# Patient Record
Sex: Female | Born: 1995
Health system: Southern US, Community
[De-identification: ages and names within clinical notes are randomized; demographics above are authoritative.]

## PROBLEM LIST (undated history)

## (undated) HISTORY — PX: WISDOM TOOTH EXTRACTION: SHX21

---
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
## 2020-06-19 ENCOUNTER — Ambulatory Visit
Admission: EM | Admit: 2020-06-19 | Discharge: 2020-06-19 | Disposition: A | Discharge status: Home / Self Care | Payer: BC Managed Care – PPO | Attending: Physician Assistant | Admitting: Physician Assistant

## 2020-06-19 ENCOUNTER — Encounter: Payer: Self-pay | Admitting: Emergency Medicine

## 2020-06-19 ENCOUNTER — Other Ambulatory Visit: Payer: Self-pay

## 2020-06-19 DIAGNOSIS — L03012 Cellulitis of left finger: Secondary | ICD-10-CM | POA: Diagnosis not present

## 2020-06-19 MED ORDER — CEPHALEXIN 500 MG PO CAPS: 500.0000 mg | ORAL_CAPSULE | Freq: Four times a day (QID) | ORAL | 0 refills | Status: DC

## 2020-06-19 NOTE — Discharge Instructions (Signed)
You can remove current dressing in 24 hours. Keep wound clean and dry. You can clean gently with soap and water. Continue warm soaks 10-15 mins at a time. If having continued redness/pain, start keflex as directed. Monitor for spreading redness, increased warmth, increased swelling, fever, follow up for reevaluation needed.

## 2020-06-19 NOTE — ED Triage Notes (Signed)
Pt here for left middle finger pain and swelling around cuticle x 3 days

## 2020-06-19 NOTE — ED Provider Notes (Signed)
[facility]    CSN: [id number] Arrival date & time: 06/19/20  1215      History   Chief Complaint Chief Complaint  Patient presents with  . Hand Pain    HPI Jill Cisneros is a 24 y.o. female.   24 year old female comes in for 3 day history of left middle finger pain/swelling after having a manicure. Has been doing warm compress and topical abx ointment without relief. Denies spreading erythema, fever.      History reviewed. No pertinent past medical history.  There are no problems to display for this patient.   History reviewed. No pertinent surgical history.  OB History   No obstetric history on file.      Home Medications    Prior to Admission medications   Medication Sig Start Date End Date Taking? Authorizing Provider  cephALEXin (KEFLEX) 500 MG capsule Take 1 capsule (500 mg total) by mouth 4 (four) times daily. 06/19/20   Belinda Fisher, PA-C    Family History Family History  Problem Relation Age of Onset  . Healthy Mother     Social History Social History   Tobacco Use  . Smoking status: Never Smoker  . Smokeless tobacco: Never Used  Substance Use Topics  . Alcohol use: Not Currently  . Drug use: Never     Allergies   Hydrocodone   Review of Systems Review of Systems  Reason unable to perform ROS: See HPI as above.     Physical Exam Triage Vital Signs ED Triage Vitals [06/19/20 1250]  Enc Vitals Group     BP 110/62     Pulse Rate 91     Resp 18     Temp 98.5 F (36.9 C)     Temp Source Oral     SpO2 98 %     Weight      Height      Head Circumference      Peak Flow      Pain Score 6     Pain Loc      Pain Edu?      Excl. in GC?    No data found.  Updated Vital Signs BP 110/62 (BP Location: Left Arm)   Pulse 91   Temp 98.5 F (36.9 C) (Oral)   Resp 18   SpO2 98%   Visual Acuity Right Eye Distance:   Left Eye Distance:   Bilateral Distance:    Right Eye Near:   Left Eye Near:    Bilateral  Near:     Physical Exam Constitutional:      General: She is not in acute distress.    Appearance: Normal appearance. She is well-developed. She is not toxic-appearing or diaphoretic.  HENT:     Head: Normocephalic and atraumatic.  Eyes:     Conjunctiva/sclera: Conjunctivae normal.     Pupils: Pupils are equal, round, and reactive to light.  Pulmonary:     Effort: Pulmonary effort is normal. No respiratory distress.  Musculoskeletal:     Cervical back: Normal range of motion and neck supple.     Comments: Paronychia to left middle finger with surrounding erythema. No warmth. Tender to palpation. Full ROM. NVI  Skin:    General: Skin is warm and dry.  Neurological:     Mental Status: She is alert and oriented to person, place, and time.      UC Treatments / Results  Labs (all labs ordered are listed, but  only abnormal results are displayed) Labs Reviewed - No data to display  EKG   Radiology No results found.  Procedures Incision and Drainage  Date/Time: 06/19/2020 1:44 PM Performed by: Belinda Fisher, PA-C Authorized by: Belinda Fisher, PA-C   Consent:    Consent obtained:  Verbal   Consent given by:  Patient   Risks discussed:  Bleeding, incomplete drainage, infection and pain   Alternatives discussed:  Alternative treatment Location:    Type:  Abscess   Location:  Upper extremity   Upper extremity location:  Finger   Finger location:  L long finger Pre-procedure details:    Skin preparation:  Chloraprep Anesthesia (see MAR for exact dosages):    Anesthesia method:  None Procedure type:    Complexity:  Simple Procedure details:    Needle aspiration: no     Incision types:  Stab incision   Incision depth:  Dermal   Scalpel blade:  11   Drainage amount:  Scant   Wound treatment:  Wound left open   Packing materials:  None Post-procedure details:    Patient tolerance of procedure:  Tolerated well, no immediate complications   (including critical care  time)  Medications Ordered in UC Medications - No data to display  Initial Impression / Assessment and Plan / UC Course  I have reviewed the triage vital signs and the nursing notes.  Pertinent labs & imaging results that were available during my care of the patient were reviewed by me and considered in my medical decision making (see chart for details).    Patient tolerated procedure well. Wound care instructions given. Will have patient continue warm compress. If symptoms not improving, can start Rx of keflex as directed. Return precautions given. Patient expresses understanding and agrees to plan.   Final Clinical Impressions(s) / UC Diagnoses   Final diagnoses:  Paronychia of left middle finger    ED Prescriptions    Medication Sig Dispense Auth. Provider   cephALEXin (KEFLEX) 500 MG capsule Take 1 capsule (500 mg total) by mouth 4 (four) times daily. 28 capsule Belinda Fisher, PA-C     PDMP not reviewed this encounter.   Belinda Fisher, PA-C 06/19/20 1345

## 2020-07-28 ENCOUNTER — Encounter: Payer: Self-pay | Admitting: Family Medicine

## 2020-07-28 ENCOUNTER — Other Ambulatory Visit: Payer: Self-pay

## 2020-07-28 ENCOUNTER — Ambulatory Visit (INDEPENDENT_AMBULATORY_CARE_PROVIDER_SITE_OTHER): Payer: BC Managed Care – PPO | Admitting: Family Medicine

## 2020-07-28 VITALS — BP 100/70 | HR 79 | Temp 97.8°F | Ht 60.5 in | Wt 118.2 lb

## 2020-07-28 DIAGNOSIS — Z3009 Encounter for other general counseling and advice on contraception: Secondary | ICD-10-CM

## 2020-07-28 MED ORDER — LEVONORGESTREL-ETHINYL ESTRAD 0.1-20 MG-MCG PO TABS: 1.0000 | ORAL_TABLET | Freq: Every day | ORAL | 3 refills | Status: DC

## 2020-07-28 NOTE — Patient Instructions (Signed)
Schedule annual for your pap smear

## 2020-07-28 NOTE — Progress Notes (Signed)
   Subjective:     Jill Cisneros is a 24 y.o. female presenting for Establish Care (needs different BC/ no PAP today but will make appt)     HPI   #Birth control - happy with current - one partner - not interested in STI testing  Review of Systems   Social History   Tobacco Use  Smoking Status Never Smoker  Smokeless Tobacco Never Used        Objective:    BP Readings from Last 3 Encounters:  07/28/20 100/70  06/19/20 110/62   Wt Readings from Last 3 Encounters:  07/28/20 118 lb 4 oz (53.6 kg)    BP 100/70   Pulse 79   Temp 97.8 F (36.6 C) (Temporal)   Ht 5' 0.5" (1.537 m)   Wt 118 lb 4 oz (53.6 kg)   LMP 07/27/2020 (Exact Date)   SpO2 100%   BMI 22.71 kg/m    Physical Exam Constitutional:      General: She is not in acute distress.    Appearance: She is well-developed. She is not diaphoretic.  HENT:     Right Ear: External ear normal.     Left Ear: External ear normal.  Eyes:     Conjunctiva/sclera: Conjunctivae normal.  Cardiovascular:     Rate and Rhythm: Normal rate.  Pulmonary:     Effort: Pulmonary effort is normal.  Musculoskeletal:     Cervical back: Neck supple.  Skin:    General: Skin is warm and dry.     Capillary Refill: Capillary refill takes less than 2 seconds.  Neurological:     Mental Status: She is alert. Mental status is at baseline.  Psychiatric:        Mood and Affect: Mood normal.        Behavior: Behavior normal.           Assessment & Plan:   Problem List Items Addressed This Visit    None    Visit Diagnoses    Birth control counseling    -  Primary   Relevant Medications   levonorgestrel-ethinyl estradiol (ALESSE) 0.1-20 MG-MCG tablet     Switch to different brand as her current brand is going to stop production.   Return in 2 months for annual and check how she is doing on new birth control   Return in 2 months (on 09/28/2020) for annual exam.  Lynnda Child, MD  This visit occurred during  the SARS-CoV-2 public health emergency.  Safety protocols were in place, including screening questions prior to the visit, additional usage of staff PPE, and extensive cleaning of exam room while observing appropriate contact time as indicated for disinfecting solutions.

## 2020-09-22 ENCOUNTER — Telehealth (INDEPENDENT_AMBULATORY_CARE_PROVIDER_SITE_OTHER): Payer: BC Managed Care – PPO | Admitting: Family Medicine

## 2020-09-22 ENCOUNTER — Encounter: Payer: Self-pay | Admitting: Family Medicine

## 2020-09-22 ENCOUNTER — Other Ambulatory Visit: Payer: Self-pay

## 2020-09-22 DIAGNOSIS — K529 Noninfective gastroenteritis and colitis, unspecified: Secondary | ICD-10-CM | POA: Diagnosis not present

## 2020-09-22 MED ORDER — DICYCLOMINE HCL 10 MG PO CAPS: ORAL_CAPSULE | ORAL | 0 refills | Status: AC

## 2020-09-22 MED ORDER — ONDANSETRON 4 MG PO TBDP: 4.0000 mg | ORAL_TABLET | Freq: Three times a day (TID) | ORAL | 0 refills | Status: AC | PRN

## 2020-09-22 NOTE — Progress Notes (Signed)
Chief Complaint  Patient presents with  . Nausea  . Diarrhea    Food poisoning Needs a note for work     Subjective Jill Cisneros is a 25 y.o. female who presents with vomiting and diarrhea. Due to COVID-19 pandemic, we are interacting via web portal for an electronic face-to-face visit. I verified patient's ID using 2 identifiers. Patient agreed to proceed with visit via this method. Patient is at home, I am at office. Patient and I are present for visit.   Symptoms began this AM.  Ate [employer] and reg food last night.  Patient has diarrhea, abd pain and nausea Patient denies vomiting, fever and URI symptoms Evaluation to date: no Sick contacts: none known  History reviewed. No pertinent past medical history. Past Surgical History:  Procedure Laterality Date  . WISDOM TOOTH EXTRACTION     Current Outpatient Medications on File Prior to Visit  Medication Sig Dispense Refill  . Ascorbic Acid (VITAMIN C) 500 MG CAPS Take 2 capsules by mouth daily.    Marland Kitchen BLACK ELDERBERRY PO Take 2,300 mg by mouth daily.    . Cholecalciferol (VITAMIN D3) 50 MCG (2000 UT) TABS Take 1 tablet by mouth daily.    Marland Kitchen levonorgestrel-ethinyl estradiol (ALESSE) 0.1-20 MG-MCG tablet Take 1 tablet by mouth daily. 84 tablet 3  . Multiple Vitamin (MULTIVITAMIN ADULT PO) Take 1 tablet by mouth daily.      Exam No conversational dyspnea Age appropriate judgment and insight Nml affect and mood  Assessment and Plan  Gastroenteritis - Plan: ondansetron (ZOFRAN-ODT) 4 MG disintegrating tablet, dicyclomine (BENTYL) 10 MG capsule  Timing doesn't make sense for food poisoning, though not managed much different than above. Letter for work exusing thru Fri or sooner if she is feeling better.  F/u prn. The patient voiced understanding and agreement to the plan.  Jilda Roche [place], DO 09/22/20  2:26 PM

## 2021-03-07 DIAGNOSIS — J029 Acute pharyngitis, unspecified: Secondary | ICD-10-CM | POA: Diagnosis not present

## 2021-03-07 DIAGNOSIS — J069 Acute upper respiratory infection, unspecified: Secondary | ICD-10-CM | POA: Diagnosis not present

## 2021-07-29 ENCOUNTER — Encounter: Payer: BC Managed Care – PPO | Admitting: Family Medicine

## 2021-09-17 ENCOUNTER — Other Ambulatory Visit: Payer: Self-pay | Admitting: Family Medicine

## 2021-09-17 DIAGNOSIS — Z3009 Encounter for other general counseling and advice on contraception: Secondary | ICD-10-CM

## 2021-09-28 ENCOUNTER — Encounter: Payer: BC Managed Care – PPO | Admitting: Family Medicine

## 2021-10-15 ENCOUNTER — Encounter: Payer: Self-pay | Admitting: Family Medicine

## 2021-10-21 ENCOUNTER — Other Ambulatory Visit: Payer: Self-pay | Admitting: Family Medicine

## 2021-10-21 DIAGNOSIS — Z3009 Encounter for other general counseling and advice on contraception: Secondary | ICD-10-CM

## 2021-10-28 ENCOUNTER — Ambulatory Visit (INDEPENDENT_AMBULATORY_CARE_PROVIDER_SITE_OTHER): Payer: 59 | Admitting: Family Medicine

## 2021-10-28 ENCOUNTER — Other Ambulatory Visit: Payer: Self-pay

## 2021-10-28 ENCOUNTER — Other Ambulatory Visit (HOSPITAL_COMMUNITY)
Admission: RE | Admit: 2021-10-28 | Discharge: 2021-10-28 | Disposition: A | Discharge status: Home / Self Care | Payer: 59 | Source: Ambulatory Visit | Attending: Family Medicine | Admitting: Family Medicine

## 2021-10-28 VITALS — BP 104/70 | HR 91 | Temp 98.3°F | Resp 16 | Ht 60.5 in | Wt 133.0 lb

## 2021-10-28 DIAGNOSIS — Z13220 Encounter for screening for lipoid disorders: Secondary | ICD-10-CM

## 2021-10-28 DIAGNOSIS — Z1159 Encounter for screening for other viral diseases: Secondary | ICD-10-CM | POA: Diagnosis not present

## 2021-10-28 DIAGNOSIS — Z0000 Encounter for general adult medical examination without abnormal findings: Secondary | ICD-10-CM | POA: Insufficient documentation

## 2021-10-28 DIAGNOSIS — R6889 Other general symptoms and signs: Secondary | ICD-10-CM | POA: Diagnosis not present

## 2021-10-28 DIAGNOSIS — Z114 Encounter for screening for human immunodeficiency virus [HIV]: Secondary | ICD-10-CM

## 2021-10-28 DIAGNOSIS — Z3009 Encounter for other general counseling and advice on contraception: Secondary | ICD-10-CM

## 2021-10-28 LAB — LIPID PANEL
Cholesterol: 206 mg/dL — ABNORMAL HIGH (ref 0–200)
HDL: 66.2 mg/dL (ref >39.00)
LDL Cholesterol: 128 mg/dL — ABNORMAL HIGH (ref 0–99)
NonHDL: 140
Total CHOL/HDL Ratio: 3
Triglycerides: 62 mg/dL (ref 0.0–149.0)
VLDL: 12.4 mg/dL (ref 0.0–40.0)

## 2021-10-28 LAB — COMPREHENSIVE METABOLIC PANEL
ALT: 13 U/L (ref 0–35)
AST: 16 U/L (ref 0–37)
Albumin: 4.7 g/dL (ref 3.5–5.2)
Alkaline Phosphatase: 57 U/L (ref 39–117)
BUN: 13 mg/dL (ref 6–23)
CO2: 26 mEq/L (ref 19–32)
Calcium: 9.8 mg/dL (ref 8.4–10.5)
Chloride: 104 mEq/L (ref 96–112)
Creatinine, Ser: 0.72 mg/dL (ref 0.40–1.20)
GFR: 115.41 mL/min (ref >60.00)
Glucose, Bld: 87 mg/dL (ref 70–99)
Potassium: 4.9 mEq/L (ref 3.5–5.1)
Sodium: 137 mEq/L (ref 135–145)
Total Bilirubin: 0.6 mg/dL (ref 0.2–1.2)
Total Protein: 7.1 g/dL (ref 6.0–8.3)

## 2021-10-28 LAB — TSH: TSH: 2.76 u[IU]/mL (ref 0.35–5.50)

## 2021-10-28 LAB — CBC
HCT: 40.3 % (ref 36.0–46.0)
Hemoglobin: 13.3 g/dL (ref 12.0–15.0)
MCHC: 33.1 g/dL (ref 30.0–36.0)
MCV: 86.3 fl (ref 78.0–100.0)
Platelets: 222 10*3/uL (ref 150.0–400.0)
RBC: 4.66 Mil/uL (ref 3.87–5.11)
RDW: 12 % (ref 11.5–15.5)
WBC: 7 10*3/uL (ref 4.0–10.5)

## 2021-10-28 MED ORDER — LEVONORGESTREL-ETHINYL ESTRAD 0.1-20 MG-MCG PO TABS: 1.0000 | ORAL_TABLET | Freq: Every day | ORAL | 3 refills | Status: DC

## 2021-10-28 NOTE — Patient Instructions (Signed)
Labs today ? ?Work on [employer] ?

## 2021-10-28 NOTE — Progress Notes (Signed)
Annual Exam   Chief Complaint:  Chief Complaint  Patient presents with   Follow-up    Discuss about taking iron, she heard that it correlates with being cold all the time. Denies any pain today.     History of Present Illness:  Jill Cisneros is a 26 y.o. No obstetric history on file. who LMP was No LMP recorded., presents today for her annual examination.    #Feeling cold - wondering if this is an indication of low iron - no hair/skin/nails change - some weight gain - cycles - 4-5 days, 3-4 pads on the heavy day  Nutrition/Lifestyle Diet: all over the place - not healthy Exercise: not currently, tries to walk on her days off She does get adequate calcium and Vitamin D in her diet.  Social History   Tobacco Use  Smoking Status Never  Smokeless Tobacco Never   Social History   Substance and Sexual Activity  Alcohol Use Yes   Comment: occassionally    Social History   Substance and Sexual Activity  Drug Use Never     Safety The patient wears seatbelts: yes.     The patient feels safe at home and in their relationships: yes.  General Health Dentist in the last year: No Eye doctor: yes  Menstrual Her menses are normal no concerns  GYN She is single partner, contraception - OCP (estrogen/progesterone).     Cervical Cancer Screening (Age 64-65) Last Pap: never  Family History of Breast Cancer: no Family History of Ovarian Cancer: no    Weight Wt Readings from Last 3 Encounters:  10/28/21 133 lb (60.3 kg)  07/28/20 118 lb 4 oz (53.6 kg)   Patient has normal BMI  BMI Readings from Last 1 Encounters:  10/28/21 25.55 kg/m     Chronic disease screening Blood pressure monitoring:  BP Readings from Last 3 Encounters:  10/28/21 104/70  07/28/20 100/70  06/19/20 110/62     Lipid Monitoring: Indication for screening: age >20, obesity, diabetes, family hx, CV risk factors.  Lipid screening: Yes  No results found for: CHOL, HDL, LDLCALC,  LDLDIRECT, TRIG, CHOLHDL   Diabetes Screening: age >29, overweight, family hx, PCOS, hx of gestational diabetes, at risk ethnicity, elevated blood pressure >135/80.  Diabetes Screening screening: Yes, Not Indicated  No results found for: HGBA1C    No past medical history on file.  Past Surgical History:  Procedure Laterality Date   WISDOM TOOTH EXTRACTION      Prior to Admission medications   Medication Sig Start Date End Date Taking? Authorizing Provider  Ascorbic Acid (VITAMIN C) 500 MG CAPS Take 2 capsules by mouth daily.   Yes [provider]  Janann Colonel 0.1-20 MG-MCG tablet Take 1 tablet by mouth once daily 10/22/21  Yes Lynnda Child, MD  BLACK ELDERBERRY PO Take 2,300 mg by mouth daily.   Yes [provider]  Cholecalciferol (VITAMIN D3) 50 MCG (2000 UT) TABS Take 1 tablet by mouth daily.   Yes [provider]  dicyclomine (BENTYL) 10 MG capsule Take 1 tab every 6 hours as needed for abdominal cramping. 09/22/20  Yes Sharlene Dory, DO  Multiple Vitamin (MULTIVITAMIN ADULT PO) Take 1 tablet by mouth daily.   Yes [provider]  ondansetron (ZOFRAN-ODT) 4 MG disintegrating tablet Take 1 tablet (4 mg total) by mouth every 8 (eight) hours as needed for nausea or vomiting. Patient not taking: Reported on 10/28/2021 09/22/20   Sharlene Dory, DO  Allergies  Allergen Reactions   Hydrocodone     Gynecologic History: No LMP recorded.  Obstetric History: No obstetric history on file.  Social History   Socioeconomic History   Marital status: Single    Spouse name: Not on file   Number of children: Not on file   Years of education: some college   Highest education level: Not on file  Occupational History   Not on file  Tobacco Use   Smoking status: Never   Smokeless tobacco: Never  Vaping Use   Vaping Use: Never used  Substance and Sexual Activity   Alcohol use: Yes    Comment: occassionally    Drug use: Never    Sexual activity: Yes    Birth control/protection: Pill  Other Topics Concern   Not on file  Social History Narrative   07/28/20   From: the area   Living: with parents   Work: [employer]      Family: twin sister and older sister, good relationship with parents      Enjoys: go fishing      Exercise: walking around at work, nothing outside of that   Diet: limits eating out, eating at home      Safety   Seat belts: Yes    Guns: Yes  and secure   Safe in relationships: Yes    Social Determinants of Health   Financial Resource Strain: Not on file  Food Insecurity: Not on file  Transportation Needs: Not on file  Physical Activity: Not on file  Stress: Not on file  Social Connections: Not on file  Intimate Partner Violence: Not on file    Family History  Problem Relation Age of Onset   Healthy Mother     Review of Systems  Constitutional:  Negative for chills and fever.  HENT:  Negative for congestion and sore throat.   Eyes:  Negative for blurred vision and double vision.  Respiratory:  Negative for shortness of breath.   Cardiovascular:  Negative for chest pain.  Gastrointestinal:  Negative for heartburn, nausea and vomiting.  Genitourinary: Negative.   Musculoskeletal: Negative.  Negative for myalgias.  Skin:  Negative for rash.  Neurological:  Negative for dizziness and headaches.  Endo/Heme/Allergies:  Does not bruise/bleed easily.       Cold intolerance  Psychiatric/Behavioral:  Negative for depression. The patient is not nervous/anxious.     Physical Exam BP 104/70 (BP Location: Left Arm, Patient Position: Sitting, Cuff Size: Normal)   Pulse 91   Temp 98.3 F (36.8 C) (Oral)   Resp 16   Ht 5' 0.5" (1.537 m)   Wt 133 lb (60.3 kg)   SpO2 100%   BMI 25.55 kg/m    BP Readings from Last 3 Encounters:  10/28/21 104/70  07/28/20 100/70  06/19/20 110/62    Wt Readings from Last 3 Encounters:  10/28/21 133 lb (60.3 kg)  07/28/20 118 lb 4 oz (53.6  kg)     Physical Exam Exam conducted with a chaperone present.  Constitutional:      General: She is not in acute distress.    Appearance: She is well-developed. She is not diaphoretic.  HENT:     Head: Normocephalic and atraumatic.     Right Ear: External ear normal.     Left Ear: External ear normal.     Nose: Nose normal.  Eyes:     General: No scleral icterus.    Extraocular Movements: Extraocular movements intact.  Conjunctiva/sclera: Conjunctivae normal.  Cardiovascular:     Rate and Rhythm: Normal rate and regular rhythm.     Heart sounds: No murmur heard. Pulmonary:     Effort: Pulmonary effort is normal. No respiratory distress.     Breath sounds: Normal breath sounds. No wheezing.  Abdominal:     General: Bowel sounds are normal. There is no distension.     Palpations: Abdomen is soft. There is no mass.     Tenderness: There is no abdominal tenderness. There is no guarding or rebound.  Genitourinary:    Vagina: Normal.     Cervix: Discharge and erythema present. No cervical bleeding.  Musculoskeletal:        General: Normal range of motion.     Cervical back: Neck supple.  Lymphadenopathy:     Cervical: No cervical adenopathy.  Skin:    General: Skin is warm and dry.     Capillary Refill: Capillary refill takes less than 2 seconds.  Neurological:     Mental Status: She is alert and oriented to person, place, and time.     Deep Tendon Reflexes: Reflexes normal.  Psychiatric:        Mood and Affect: Mood normal.        Behavior: Behavior normal.      Results: Depression screen [facility] 2/9 07/28/2020  Decreased Interest 0  Down, Depressed, Hopeless 0  PHQ - 2 Score 0      Assessment: 26 y.o. No obstetric history on file. female here for routine annual examination.  Plan: Problem List Items Addressed This Visit   None Visit Diagnoses     Screening for HIV (human immunodeficiency virus)    -  Primary   Relevant Orders   HIV Antibody (routine  testing w rflx)   Need for hepatitis C screening test       Relevant Orders   Hepatitis C antibody   Annual physical exam       Relevant Orders   Comprehensive metabolic panel   Lipid panel   Cytology - PAP([place])   Cold intolerance       Relevant Orders   TSH   CBC   Screening for hyperlipidemia       Relevant Orders   Lipid panel   Birth control counseling       Relevant Medications   levonorgestrel-ethinyl estradiol (AVIANE) 0.1-20 MG-MCG tablet        Screening: -- Blood pressure screen normal -- cholesterol screening: will obtain -- Weight screening: normal -- Diabetes Screening: will obtain -- Nutrition: encouraged healthy diet   Psych -- Depression screening (PHQ-9):  Depression screen [facility] 2/9 07/28/2020  Decreased Interest 0  Down, Depressed, Hopeless 0  PHQ - 2 Score 0      Safety -- tobacco screening: not using -- alcohol screening:  low-risk usage. -- no evidence of domestic violence or intimate partner violence. -- STD screening: gonorrhea/chlamydia NAAT (Recommended for <24 years) not collected per patient request.  Cancer Screening -- pap smear collected per ASCCP guidelines -- family history of breast cancer screening: done. not at high risk.   Immunizations Immunization History  Administered Date(s) Administered   DTaP 08/14/1995, 10/09/1995, 12/05/1995, 12/26/1996, 03/14/2001   Hepatitis A 07/22/2008   Hepatitis B Oct 17, 1994, 07/13/1995, 03/19/1996   HiB (PRP-OMP) 08/14/1995, 10/09/1995, 12/05/1995, 12/26/1996   Hpv-Unspecified 07/22/2008, 10/09/2008, 02/17/2009   IPV 08/14/1995, 10/09/1995, 03/14/2001   MMR 06/13/1996, 03/14/2001   Meningococcal Conjugate 07/22/2008   Moderna Sars-Covid-2 Vaccination 01/28/2020, 02/24/2020  OPV 06/13/1996   Tdap 04/22/2008    -- flu vaccine not up to date - declined -- TDAP q10 years unknown, record requested-- HPV vaccination series (Age <26, shared decision 27-45): received -- Covid-19  Vaccine up to date  Encouraged regular vision and dental screening. Encouraged healthy exercise and diet.   Lynnda Child

## 2021-10-29 ENCOUNTER — Encounter: Payer: Self-pay | Admitting: Family Medicine

## 2021-10-29 LAB — CYTOLOGY - PAP
Comment: NEGATIVE
Diagnosis: NEGATIVE
High risk HPV: NEGATIVE

## 2021-10-29 LAB — HEPATITIS C ANTIBODY
Hepatitis C Ab: NONREACTIVE
SIGNAL TO CUT-OFF: <0.02 (ref <1.00)

## 2021-10-29 LAB — HIV ANTIBODY (ROUTINE TESTING W REFLEX): HIV 1&2 Ab, 4th Generation: NONREACTIVE

## 2021-11-03 ENCOUNTER — Encounter: Payer: Self-pay | Admitting: Family Medicine

## 2022-01-26 ENCOUNTER — Encounter: Payer: Self-pay | Admitting: Family Medicine

## 2022-01-26 DIAGNOSIS — T753XXS Motion sickness, sequela: Secondary | ICD-10-CM

## 2022-01-27 MED ORDER — SCOPOLAMINE 1 MG/3DAYS TD PT72: 1.0000 | MEDICATED_PATCH | TRANSDERMAL | 0 refills | Status: AC

## 2022-08-02 ENCOUNTER — Ambulatory Visit
Admission: RE | Admit: 2022-08-02 | Discharge: 2022-08-02 | Disposition: A | Discharge status: Home / Self Care | Payer: 59 | Source: Ambulatory Visit | Attending: Physician Assistant | Admitting: Physician Assistant

## 2022-08-02 VITALS — BP 112/65 | HR 83 | Temp 98.3°F | Resp 16

## 2022-08-02 DIAGNOSIS — N390 Urinary tract infection, site not specified: Secondary | ICD-10-CM | POA: Insufficient documentation

## 2022-08-02 DIAGNOSIS — R1032 Left lower quadrant pain: Secondary | ICD-10-CM | POA: Diagnosis not present

## 2022-08-02 DIAGNOSIS — R319 Hematuria, unspecified: Secondary | ICD-10-CM | POA: Diagnosis not present

## 2022-08-02 LAB — POCT URINALYSIS DIP (MANUAL ENTRY)
Bilirubin, UA: NEGATIVE
Glucose, UA: NEGATIVE mg/dL
Ketones, POC UA: NEGATIVE mg/dL
Nitrite, UA: POSITIVE — AB
Protein Ur, POC: 100 mg/dL — AB
Spec Grav, UA: >=1.03 — AB (ref 1.010–1.025)
Urobilinogen, UA: 1 E.U./dL
pH, UA: 6 (ref 5.0–8.0)

## 2022-08-02 LAB — POCT URINE PREGNANCY: Preg Test, Ur: NEGATIVE

## 2022-08-02 MED ORDER — NITROFURANTOIN MONOHYD MACRO 100 MG PO CAPS: 100.0000 mg | ORAL_CAPSULE | Freq: Two times a day (BID) | ORAL | 0 refills | Status: AC

## 2022-08-02 MED ORDER — IBUPROFEN 600 MG PO TABS: 600.0000 mg | ORAL_TABLET | Freq: Four times a day (QID) | ORAL | 0 refills | Status: AC | PRN

## 2022-08-02 NOTE — Discharge Instructions (Signed)
Advised to take the Macrobid every 12 hours to treat the urinary tract infection. Advised take ibuprofen 600 mg every 8 hours with food to treat and decrease the pain. Advised to increase fluid intake and flush the kidneys as frequently as possible. The complete blood count and a comprehensive metabolic profile will be completed in the next 48-72 hours.  If you do not get a call from the office that indicates that this test is negative. Log onto MyChart to view the test results when they post in 48-72 hours.

## 2022-08-02 NOTE — ED Provider Notes (Signed)
[facility]    CSN: [id number] Arrival date & time: 08/02/22  1028      History   Chief Complaint Chief Complaint  Patient presents with   Flank Pain    HPI Jill Cisneros is a 26 y.o. female.   26 year old female presents with abdominal pain.  Patient indicates for the past 2 days she has been having persistent left lower quadrant pain and discomfort.  She relates that it is sharp, intermittent, and localized.  Patient indicates that she has not had any trauma to the area, she is not having any indigestion or heartburn, she relates that she is not having any dysuria, frequency, urgency.  Patient denies having any fever or chills.  Patient indicates that the pain is a 5 on a scale of 1-10 in the exam room today.  She does indicate that the pain has gotten worse.  Patient indicates she has not taken the medication to help relieve the pain.  Patient relates her last period was 2 and half weeks ago and it was normal.   Flank Pain    History reviewed. No pertinent past medical history.  There are no problems to display for this patient.   Past Surgical History:  Procedure Laterality Date   WISDOM TOOTH EXTRACTION      OB History   No obstetric history on file.      Home Medications    Prior to Admission medications   Medication Sig Start Date End Date Taking? Authorizing Provider  ibuprofen (ADVIL) 600 MG tablet Take 1 tablet (600 mg total) by mouth every 6 (six) hours as needed. 08/02/22  Yes Ellsworth Lennox, PA-C  nitrofurantoin, macrocrystal-monohydrate, (MACROBID) 100 MG capsule Take 1 capsule (100 mg total) by mouth 2 (two) times daily. 08/02/22  Yes Ellsworth Lennox, PA-C  Ascorbic Acid (VITAMIN C) 500 MG CAPS Take 2 capsules by mouth daily.    [provider]  BLACK ELDERBERRY PO Take 2,300 mg by mouth daily.    [provider]  Cholecalciferol (VITAMIN D3) 50 MCG (2000 UT) TABS Take 1 tablet by mouth daily.    [provider]   dicyclomine (BENTYL) 10 MG capsule Take 1 tab every 6 hours as needed for abdominal cramping. 09/22/20   Sharlene Dory, DO  levonorgestrel-ethinyl estradiol (AVIANE) 0.1-20 MG-MCG tablet Take 1 tablet by mouth daily. 10/28/21   Gweneth Dimitri, MD  Multiple Vitamin (MULTIVITAMIN ADULT PO) Take 1 tablet by mouth daily.    [provider]  ondansetron (ZOFRAN-ODT) 4 MG disintegrating tablet Take 1 tablet (4 mg total) by mouth every 8 (eight) hours as needed for nausea or vomiting. Patient not taking: Reported on 10/28/2021 09/22/20   Sharlene Dory, DO  scopolamine (TRANSDERM-SCOP) 1 MG/3DAYS Place 1 patch (1.5 mg total) onto the skin every 3 (three) days. 01/27/22   Gweneth Dimitri, MD    Family History Family History  Problem Relation Age of Onset   Healthy Mother     Social History Social History   Tobacco Use   Smoking status: Never   Smokeless tobacco: Never  Vaping Use   Vaping Use: Never used  Substance Use Topics   Alcohol use: Yes    Comment: occassionally    Drug use: Never     Allergies   Hydrocodone   Review of Systems Review of Systems  Genitourinary:  Positive for flank pain (left lower quadrant).     Physical Exam Triage Vital Signs ED Triage Vitals  Enc  Vitals Group     BP 08/02/22 1045 112/65     Pulse Rate 08/02/22 1043 83     Resp 08/02/22 1043 16     Temp 08/02/22 1043 98.3 F (36.8 C)     Temp src --      SpO2 08/02/22 1045 98 %     Weight --      Height --      Head Circumference --      Peak Flow --      Pain Score 08/02/22 1042 5     Pain Loc --      Pain Edu? --      Excl. in GC? --    No data found.  Updated Vital Signs BP 112/65   Pulse 83   Temp 98.3 F (36.8 C)   Resp 16   SpO2 98%   Visual Acuity Right Eye Distance:   Left Eye Distance:   Bilateral Distance:    Right Eye Near:   Left Eye Near:    Bilateral Near:     Physical Exam Constitutional:      Appearance: Normal appearance.   Cardiovascular:     Rate and Rhythm: Normal rate and regular rhythm.     Heart sounds: Normal heart sounds.  Pulmonary:     Effort: Pulmonary effort is normal.     Breath sounds: Normal breath sounds and air entry. No wheezing, rhonchi or rales.  Abdominal:     General: Abdomen is flat. Bowel sounds are normal.     Palpations: Abdomen is soft.     Tenderness: There is abdominal tenderness in the left lower quadrant. There is guarding (mild guarding with palpation). There is no rebound. Negative signs include Rovsing's sign and McBurney's sign.     Comments: The complete blood count and comprehensive metabolic profile labs will be completed in 24-48 hours.  If you do not get a call from this office that indicates these test are normal or negative.  Log onto MyChart to view the test results when they post in 48-72 hours. Advised take ibuprofen 600 mg every 8 hours with food to help relieve pain and discomfort.  Advised that if the pain continues, worsens, and/or is associated with fever and nausea then advised that she will report to the emergency room or return to urgent care for evaluation.  Lymphadenopathy:     Cervical: No cervical adenopathy.  Neurological:     Mental Status: She is alert.      UC Treatments / Results  Labs (all labs ordered are listed, but only abnormal results are displayed) Labs Reviewed  POCT URINALYSIS DIP (MANUAL ENTRY) - Abnormal; Notable for the following components:      Result Value   Clarity, UA cloudy (*)    Spec Grav, UA >=1.030 (*)    Blood, UA large (*)    Protein Ur, POC =100 (*)    Nitrite, UA Positive (*)    Leukocytes, UA Large (3+) (*)    All other components within normal limits  URINE CULTURE  CBC  COMPREHENSIVE METABOLIC PANEL  POCT URINE PREGNANCY    EKG   Radiology No results found.  Procedures Procedures (including critical care time)  Medications Ordered in UC Medications - No data to display  Initial Impression /  Assessment and Plan / UC Course  I have reviewed the triage vital signs and the nursing notes.  Pertinent labs & imaging results that were available during my care of  the patient were reviewed by me and considered in my medical decision making (see chart for details).    Plan: 1.  The abdominal pain will be treated with the following: A.  Ibuprofen 600 mg every 8 hours with food to help relieve pain and discomfort. B.  CBC and CMP lab results are pending. 2.  The urinary tract infection will be treated with the following: A.  Macrobid every 12 hours until completed to treat the infection. B.  Urine culture is pending 3.  Advised patient that if symptoms fail to improve over the next 48 to 72 hours and is followed with fever and symptoms worsen then she is advised to return to urgent care for reevaluation or to the emergency room. Final Clinical Impressions(s) / UC Diagnoses   Final diagnoses:  Left lower quadrant abdominal pain  Urinary tract infection with hematuria, site unspecified     Discharge Instructions      Advised to take the Macrobid every 12 hours to treat the urinary tract infection. Advised take ibuprofen 600 mg every 8 hours with food to treat and decrease the pain. Advised to increase fluid intake and flush the kidneys as frequently as possible. The complete blood count and a comprehensive metabolic profile will be completed in the next 48-72 hours.  If you do not get a call from the office that indicates that this test is negative. Log onto MyChart to view the test results when they post in 48-72 hours.     ED Prescriptions     Medication Sig Dispense Auth. Provider   ibuprofen (ADVIL) 600 MG tablet Take 1 tablet (600 mg total) by mouth every 6 (six) hours as needed. 30 tablet Ellsworth Lennox, PA-C   nitrofurantoin, macrocrystal-monohydrate, (MACROBID) 100 MG capsule Take 1 capsule (100 mg total) by mouth 2 (two) times daily. 10 capsule Ellsworth Lennox, PA-C       PDMP not reviewed this encounter.   Ellsworth Lennox, PA-C 08/02/22 1126

## 2022-08-02 NOTE — ED Triage Notes (Signed)
Pt is present today with left side flank pain x2 days. Pt describes the pain as sharp

## 2022-08-03 LAB — COMPREHENSIVE METABOLIC PANEL
ALT: 15 IU/L (ref 0–32)
AST: 15 IU/L (ref 0–40)
Albumin/Globulin Ratio: 2 (ref 1.2–2.2)
Albumin: 4.7 g/dL (ref 4.0–5.0)
Alkaline Phosphatase: 70 IU/L (ref 44–121)
BUN/Creatinine Ratio: 6 — ABNORMAL LOW (ref 9–23)
BUN: 5 mg/dL — ABNORMAL LOW (ref 6–20)
Bilirubin Total: 0.4 mg/dL (ref 0.0–1.2)
CO2: 19 mmol/L — ABNORMAL LOW (ref 20–29)
Calcium: 9.5 mg/dL (ref 8.7–10.2)
Chloride: 107 mmol/L — ABNORMAL HIGH (ref 96–106)
Creatinine, Ser: 0.82 mg/dL (ref 0.57–1.00)
Globulin, Total: 2.3 g/dL (ref 1.5–4.5)
Glucose: 113 mg/dL — ABNORMAL HIGH (ref 70–99)
Potassium: 4.4 mmol/L (ref 3.5–5.2)
Sodium: 141 mmol/L (ref 134–144)
Total Protein: 7 g/dL (ref 6.0–8.5)
eGFR: 100 mL/min/{1.73_m2} (ref >59)

## 2022-08-03 LAB — CBC
Hematocrit: 40.5 % (ref 34.0–46.6)
Hemoglobin: 13.5 g/dL (ref 11.1–15.9)
MCH: 29 pg (ref 26.6–33.0)
MCHC: 33.3 g/dL (ref 31.5–35.7)
MCV: 87 fL (ref 79–97)
Platelets: 257 10*3/uL (ref 150–450)
RBC: 4.65 x10E6/uL (ref 3.77–5.28)
RDW: 11.5 % — ABNORMAL LOW (ref 11.7–15.4)
WBC: 9.5 10*3/uL (ref 3.4–10.8)

## 2022-08-03 LAB — URINE CULTURE

## 2022-08-04 LAB — URINE CULTURE: Culture: >=100000 — AB

## 2022-08-11 ENCOUNTER — Telehealth: Payer: Self-pay | Admitting: Family Medicine

## 2022-08-11 ENCOUNTER — Telehealth: Payer: 59 | Admitting: Physician Assistant

## 2022-08-11 DIAGNOSIS — R3989 Other symptoms and signs involving the genitourinary system: Secondary | ICD-10-CM | POA: Diagnosis not present

## 2022-08-11 MED ORDER — CEPHALEXIN 500 MG PO CAPS: 500.0000 mg | ORAL_CAPSULE | Freq: Two times a day (BID) | ORAL | 0 refills | Status: AC

## 2022-08-11 NOTE — Progress Notes (Signed)

## 2022-08-11 NOTE — Telephone Encounter (Signed)
Patient was seen in urgent care on 08/02/22 and diagnosed with a uti and prescribed nitrofurantoin, macrocrystal-monohydrate, (MACROBID) 100 MG capsule  . She called in today stating that she has taken all of the medication but the uti has came back and she would like to know if more of this medication or something stronger can be called in for her?

## 2022-08-16 NOTE — Telephone Encounter (Signed)
Called patient was able to get e visit and was treated. No further action needed from our office.

## 2022-10-06 ENCOUNTER — Encounter: Payer: Self-pay | Admitting: Family

## 2022-10-12 ENCOUNTER — Telehealth: Payer: Self-pay

## 2022-10-12 NOTE — Telephone Encounter (Signed)
Spoke to pt, she stated the reason she cancelled the last two toc appts was due to her insurance changing to [place] we're out of network. Call back # [phone]

## 2022-10-12 NOTE — Telephone Encounter (Signed)
Please call patient for TOC had cancelled last 2 set up with Tabitha.   Once scheduled let me know so I can send for refill on : levonorgestrel-ethinyl estradiol (AVIANE) 0.1-20 MG-MCG tablet

## 2022-10-14 NOTE — Telephone Encounter (Signed)
Called patient she states she has not set up appointment for new provider yet but will try to have that done before next refill. Ok to refill once for patient?

## 2022-10-14 NOTE — Telephone Encounter (Signed)
[facility] faxed refill request for levonor/Ethi 0.1-0.'02mg'$  (Aviane). See note below.sending to [place].

## 2022-10-17 ENCOUNTER — Other Ambulatory Visit: Payer: Self-pay

## 2022-10-17 DIAGNOSIS — Z3009 Encounter for other general counseling and advice on contraception: Secondary | ICD-10-CM

## 2022-10-17 MED ORDER — LEVONORGESTREL-ETHINYL ESTRAD 0.1-20 MG-MCG PO TABS: 1.0000 | ORAL_TABLET | Freq: Every day | ORAL | 0 refills | Status: AC

## 2022-10-17 NOTE — Telephone Encounter (Signed)
One month sent in as instructed.  No further action needed at this time.

## 2022-10-31 ENCOUNTER — Encounter: Payer: 59 | Admitting: Family Medicine

## 2023-01-06 ENCOUNTER — Other Ambulatory Visit: Payer: Self-pay | Admitting: Family

## 2023-01-06 DIAGNOSIS — Z3009 Encounter for other general counseling and advice on contraception: Secondary | ICD-10-CM
